# Patient Record
Sex: Female | Born: 1960 | Race: Black or African American | Hispanic: No | Marital: Married
Health system: Southern US, Community
[De-identification: ages and names within clinical notes are randomized; demographics above are authoritative.]

---

## 1997-09-25 ENCOUNTER — Ambulatory Visit (HOSPITAL_COMMUNITY): Admission: RE | Admit: 1997-09-25 | Discharge: 1997-09-25 | Payer: Self-pay | Admitting: Obstetrics and Gynecology

## 1998-09-22 ENCOUNTER — Other Ambulatory Visit: Admission: RE | Admit: 1998-09-22 | Discharge: 1998-09-22 | Payer: Self-pay | Admitting: *Deleted

## 1998-10-07 ENCOUNTER — Encounter: Admission: RE | Admit: 1998-10-07 | Discharge: 1999-01-05 | Payer: Self-pay | Admitting: Obstetrics and Gynecology

## 1998-10-29 ENCOUNTER — Ambulatory Visit (HOSPITAL_COMMUNITY): Admission: RE | Admit: 1998-10-29 | Discharge: 1998-10-29 | Payer: Self-pay | Admitting: Obstetrics and Gynecology

## 1998-10-29 ENCOUNTER — Encounter: Payer: Self-pay | Admitting: Obstetrics and Gynecology

## 1999-01-24 ENCOUNTER — Encounter: Payer: Self-pay | Admitting: Obstetrics & Gynecology

## 1999-01-24 ENCOUNTER — Ambulatory Visit (HOSPITAL_COMMUNITY): Admission: RE | Admit: 1999-01-24 | Discharge: 1999-01-24 | Payer: Self-pay | Admitting: Obstetrics & Gynecology

## 1999-02-23 ENCOUNTER — Ambulatory Visit (HOSPITAL_COMMUNITY): Admission: RE | Admit: 1999-02-23 | Discharge: 1999-02-23 | Payer: Self-pay | Admitting: Obstetrics and Gynecology

## 1999-02-23 ENCOUNTER — Encounter: Payer: Self-pay | Admitting: Obstetrics and Gynecology

## 1999-04-01 ENCOUNTER — Encounter (HOSPITAL_COMMUNITY): Admission: RE | Admit: 1999-04-01 | Discharge: 1999-04-07 | Payer: Self-pay | Admitting: Obstetrics and Gynecology

## 1999-04-06 ENCOUNTER — Encounter (INDEPENDENT_AMBULATORY_CARE_PROVIDER_SITE_OTHER): Payer: Self-pay

## 1999-04-06 ENCOUNTER — Inpatient Hospital Stay (HOSPITAL_COMMUNITY): Admission: AD | Admit: 1999-04-06 | Discharge: 1999-04-08 | Payer: Self-pay | Admitting: Obstetrics and Gynecology

## 2009-04-07 ENCOUNTER — Ambulatory Visit (HOSPITAL_COMMUNITY): Admission: RE | Admit: 2009-04-07 | Discharge: 2009-04-07 | Payer: Self-pay | Admitting: Internal Medicine

## 2009-04-16 ENCOUNTER — Encounter: Admission: RE | Admit: 2009-04-16 | Discharge: 2009-04-16 | Payer: Self-pay | Admitting: Internal Medicine

## 2009-04-16 IMAGING — MG MM DIGITAL DIAGNOSTIC LIMITED*L*
3 series · 3 of 3 positions shown · non-contrast
Comparison: [DATE]

CLINICAL DATA: Abnormal screening mammogram with asymmetric
density in the upper outer left breast.

DIGITAL DIAGNOSTIC LEFT MAMMOGRAM  AND LEFT BREAST ULTRASOUND

[L CC]
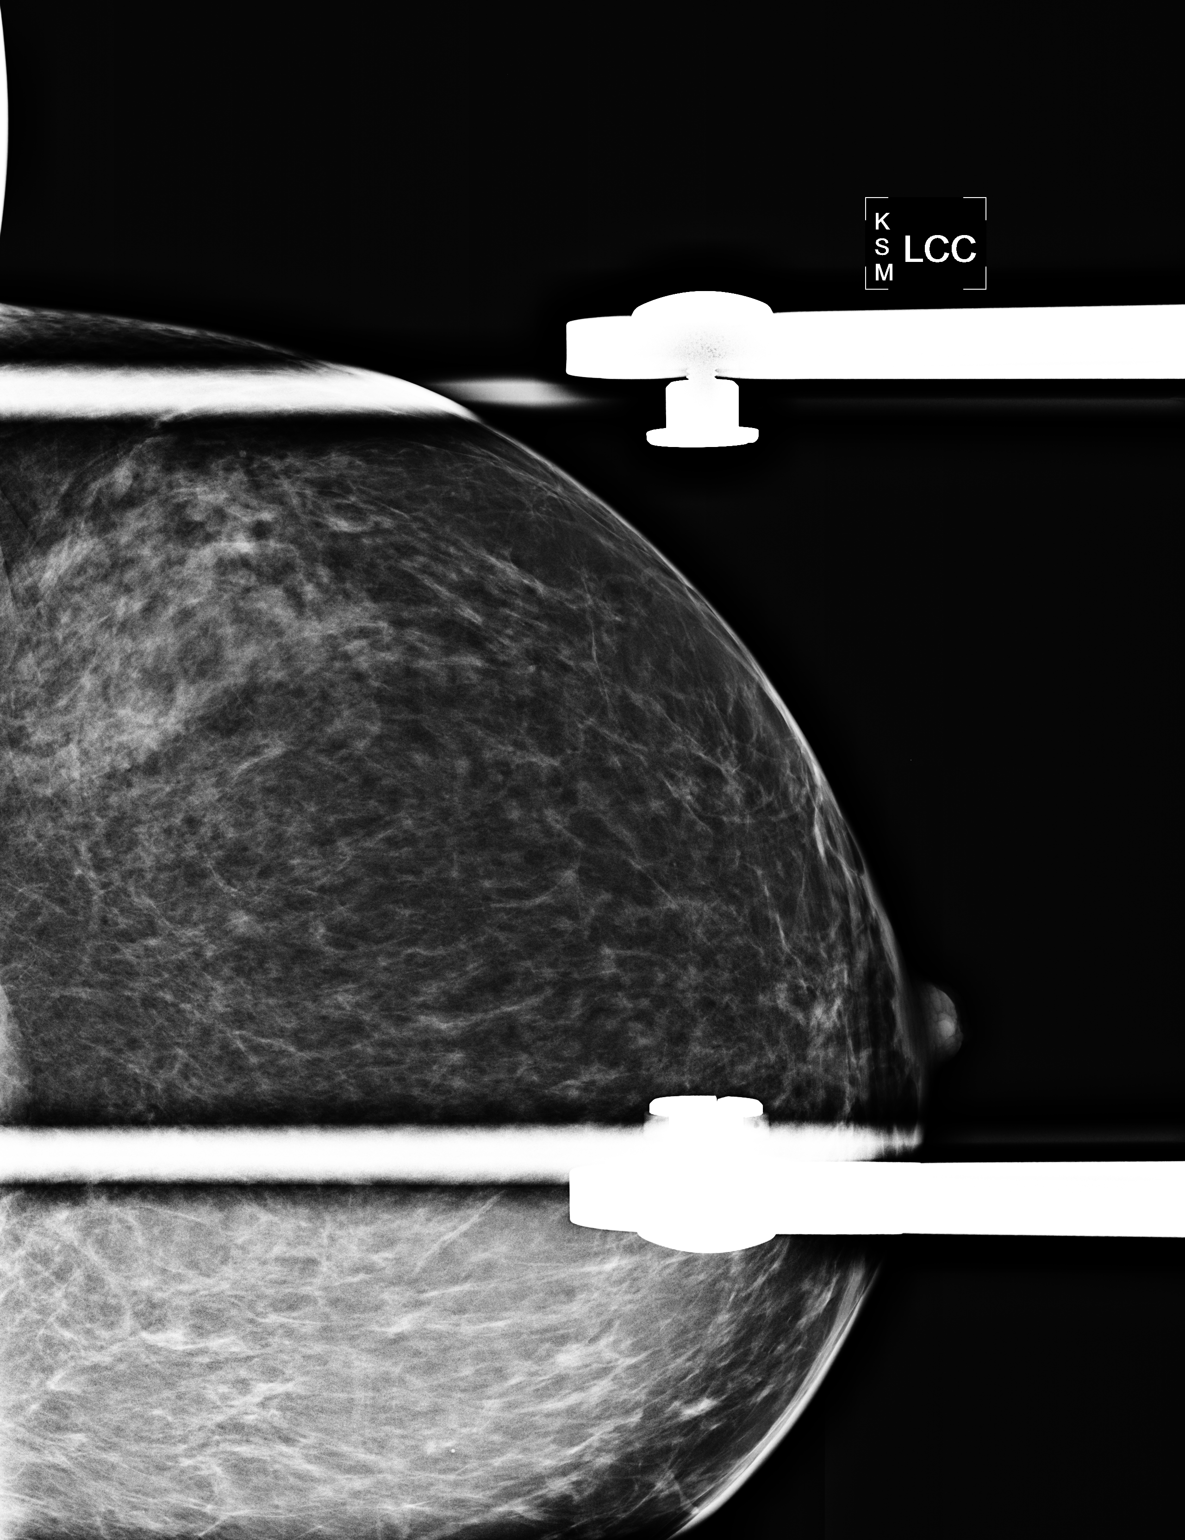

[L MLO]
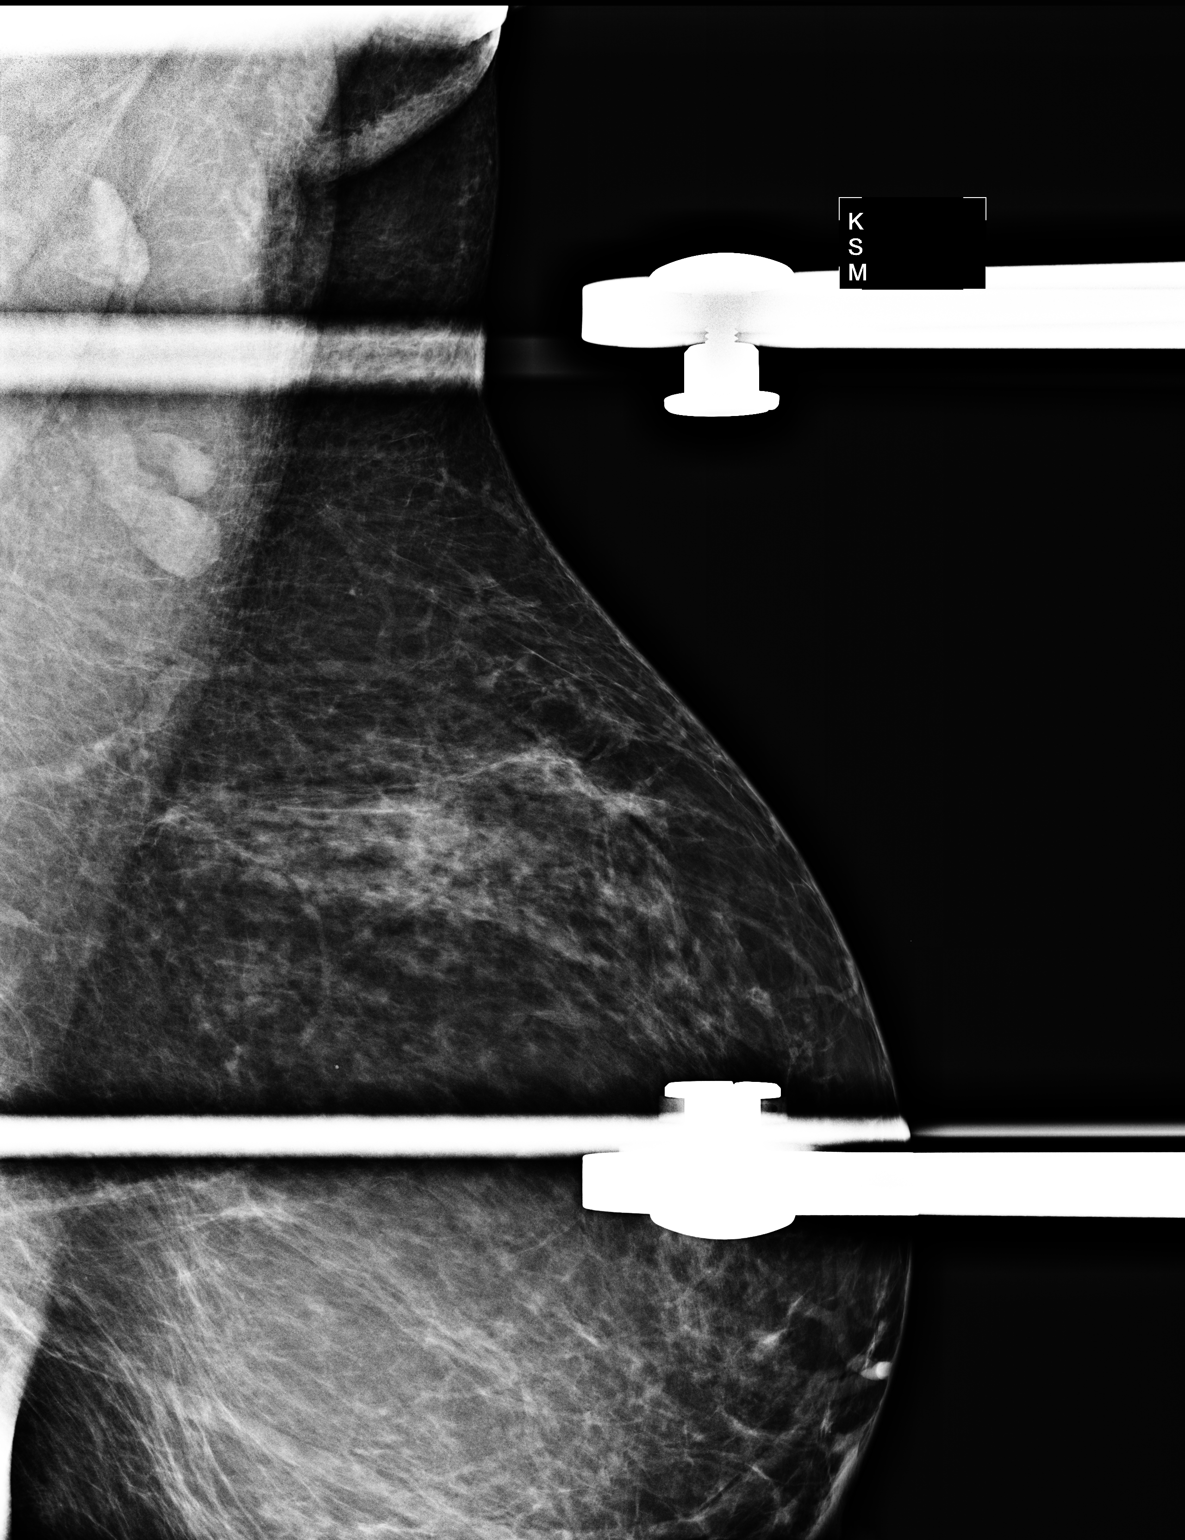

[L XCCL]
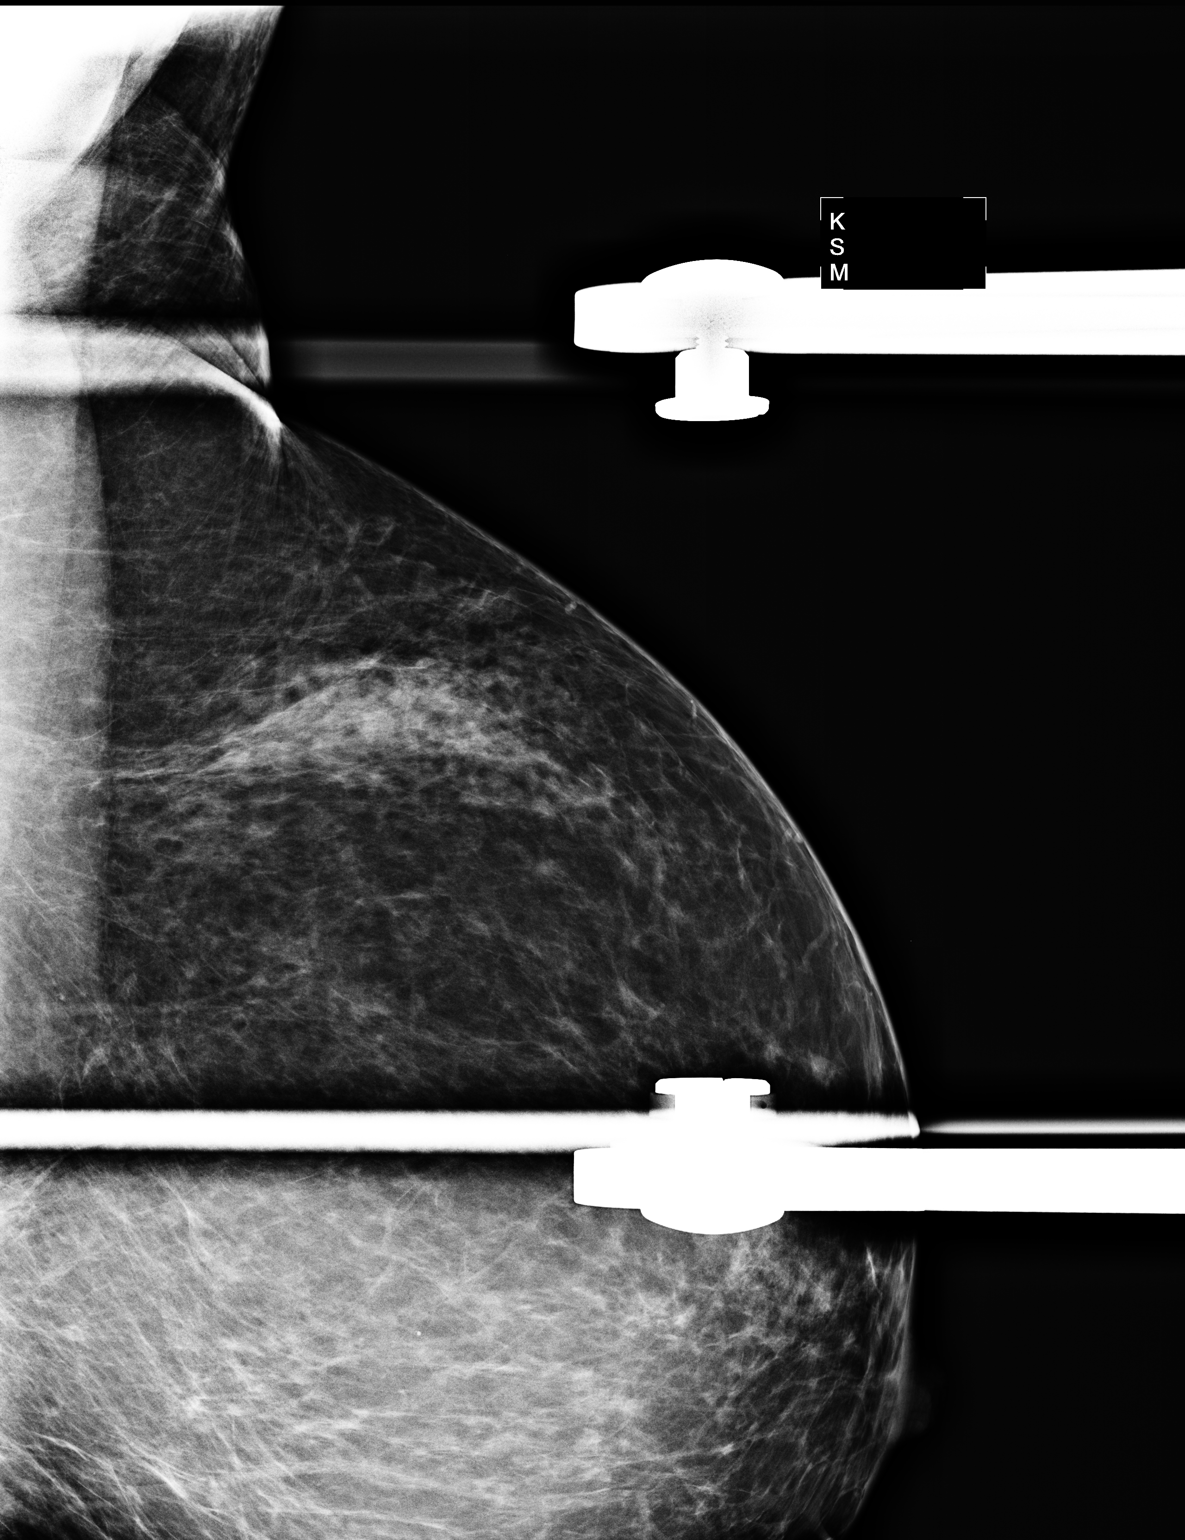

[3 of 3 positions shown; findings below may reference images not displayed]

FINDINGS: CC and MLO spot compression views of the left breast
demonstrates no evidence of mass, distortion, or suspicious
calcifications.Heterogeneously dense breast tissue in the upper
outer left breast is identified, which is asymmetric when compared
to the right.

On physical exam, no palpable abnormalities are identified in the
upper outer left breast

Targeted ultrasound of the upper outer left breast demonstrates no
evidence of solid or cystic mass, distortion or abnormal areas of
shadowing. Normal dense fibroglandular tissue in the upper outer
left breast is noted.
IMPRESSION: Normal dense fibroglandular tissue in the upper outer left breast
corresponding to the asymmetric density identified on screening
mammogram.

These findings were discussed with the patient.  She was encouraged
to continue monthly self exams and to contact her primary physician
if any changes noted.

BI-RADS CATEGORY 1:  Negative.

Recommend bilateral screening mammograms in 1 year.

## 2010-03-31 ENCOUNTER — Ambulatory Visit: Payer: Self-pay | Admitting: Diagnostic Radiology

## 2010-03-31 ENCOUNTER — Emergency Department (HOSPITAL_BASED_OUTPATIENT_CLINIC_OR_DEPARTMENT_OTHER): Admission: EM | Admit: 2010-03-31 | Discharge: 2010-03-31 | Payer: Self-pay | Admitting: Emergency Medicine

## 2010-07-19 LAB — RAPID STREP SCREEN (MED CTR MEBANE ONLY): Streptococcus, Group A Screen (Direct): NEGATIVE

## 2019-01-02 ENCOUNTER — Other Ambulatory Visit: Payer: Self-pay

## 2019-01-02 DIAGNOSIS — Z20822 Contact with and (suspected) exposure to covid-19: Secondary | ICD-10-CM

## 2019-01-03 LAB — NOVEL CORONAVIRUS, NAA: SARS-CoV-2, NAA: NOT DETECTED

## 2019-02-10 ENCOUNTER — Other Ambulatory Visit: Payer: Self-pay

## 2019-02-10 DIAGNOSIS — Z20822 Contact with and (suspected) exposure to covid-19: Secondary | ICD-10-CM

## 2019-02-12 LAB — NOVEL CORONAVIRUS, NAA: SARS-CoV-2, NAA: NOT DETECTED

## 2019-05-20 ENCOUNTER — Ambulatory Visit: Payer: Self-pay | Attending: Internal Medicine

## 2019-05-20 DIAGNOSIS — Z20822 Contact with and (suspected) exposure to covid-19: Secondary | ICD-10-CM

## 2019-05-22 LAB — NOVEL CORONAVIRUS, NAA: SARS-CoV-2, NAA: NOT DETECTED

## 2019-07-12 ENCOUNTER — Ambulatory Visit: Payer: Self-pay | Attending: Internal Medicine

## 2019-07-12 DIAGNOSIS — Z23 Encounter for immunization: Secondary | ICD-10-CM | POA: Insufficient documentation

## 2019-07-12 NOTE — Progress Notes (Signed)
   Covid-19 Vaccination Clinic  Name:  Jean Church    MRN: [id number] DOB: 01-02-1961  07/12/2019  [name] was observed post Covid-19 immunization for 15 minutes without incident. She was provided with Vaccine Information Sheet and instruction to access the V-Safe system.   [name] was instructed to call 911 with any severe reactions post vaccine: Marland Kitchen Difficulty breathing  . Swelling of face and throat  . A fast heartbeat  . A bad rash all over body  . Dizziness and weakness   Immunizations Administered    Name Date Dose VIS Date Route   Pfizer COVID-19 Vaccine 07/12/2019 11:56 AM 0.3 mL 04/18/2019 Intramuscular   Manufacturer: [employer], [employer]   Lot: [id number]   NDC: [address]-6

## 2019-08-02 ENCOUNTER — Ambulatory Visit: Payer: Self-pay | Attending: Internal Medicine

## 2019-08-02 DIAGNOSIS — Z23 Encounter for immunization: Secondary | ICD-10-CM

## 2019-08-02 NOTE — Progress Notes (Signed)
   Covid-19 Vaccination Clinic  Name:  Jean Church    MRN: [id number] DOB: January 03, 1961  08/02/2019  Jean Church was observed post Covid-19 immunization for 15 minutes without incident. She was provided with Vaccine Information Sheet and instruction to access the V-Safe system.   Jean Church was instructed to call 911 with any severe reactions post vaccine: Marland Kitchen Difficulty breathing  . Swelling of face and throat  . A fast heartbeat  . A bad rash all over body  . Dizziness and weakness   Immunizations Administered    Name Date Dose VIS Date Route   Pfizer COVID-19 Vaccine 08/02/2019 12:06 PM 0.3 mL 04/18/2019 Intramuscular   Manufacturer: [employer], [employer]   Lot: [id number]   NDC: [address]-2

## 2020-04-27 ENCOUNTER — Ambulatory Visit: Payer: Self-pay | Attending: Internal Medicine

## 2020-04-27 DIAGNOSIS — Z23 Encounter for immunization: Secondary | ICD-10-CM

## 2020-04-27 NOTE — Progress Notes (Signed)
   Covid-19 Vaccination Clinic  Name:  Jean Church    MRN: [id number] DOB: 03-11-1961  04/27/2020  [name] was observed post Covid-19 immunization for 15 minutes without incident. She was provided with Vaccine Information Sheet and instruction to access the V-Safe system.   [name] was instructed to call 911 with any severe reactions post vaccine: Marland Kitchen Difficulty breathing  . Swelling of face and throat  . A fast heartbeat  . A bad rash all over body  . Dizziness and weakness   Immunizations Administered    Name Date Dose VIS Date Route   Pfizer COVID-19 Vaccine 04/27/2020  3:30 PM 0.3 mL 02/25/2020 Intramuscular   Manufacturer: [employer], [employer]   Lot: [id number]   NDC: [id number]

## 2020-04-28 ENCOUNTER — Other Ambulatory Visit: Payer: Self-pay

## 2020-04-28 DIAGNOSIS — Z20822 Contact with and (suspected) exposure to covid-19: Secondary | ICD-10-CM

## 2020-04-29 ENCOUNTER — Other Ambulatory Visit: Payer: Self-pay

## 2020-04-29 LAB — SARS-COV-2, NAA 2 DAY TAT

## 2020-04-29 LAB — NOVEL CORONAVIRUS, NAA: SARS-CoV-2, NAA: NOT DETECTED

## 2020-05-03 ENCOUNTER — Ambulatory Visit: Payer: Self-pay

## 2020-06-01 ENCOUNTER — Other Ambulatory Visit: Payer: BLUE CROSS/BLUE SHIELD

## 2020-06-01 DIAGNOSIS — Z20822 Contact with and (suspected) exposure to covid-19: Secondary | ICD-10-CM

## 2020-06-02 LAB — NOVEL CORONAVIRUS, NAA: SARS-CoV-2, NAA: NOT DETECTED

## 2020-06-02 LAB — SARS-COV-2, NAA 2 DAY TAT

## 2020-06-11 ENCOUNTER — Other Ambulatory Visit: Payer: BLUE CROSS/BLUE SHIELD

## 2020-06-11 ENCOUNTER — Other Ambulatory Visit: Payer: Self-pay

## 2020-06-11 DIAGNOSIS — Z20822 Contact with and (suspected) exposure to covid-19: Secondary | ICD-10-CM

## 2020-06-12 LAB — NOVEL CORONAVIRUS, NAA: SARS-CoV-2, NAA: DETECTED — AB

## 2020-06-12 LAB — SARS-COV-2, NAA 2 DAY TAT
